# Patient Record
Sex: Male | Born: 1995 | Marital: Single | Smoking: Never smoker
Health system: Southern US, Community
[De-identification: ages and names within clinical notes are randomized; demographics above are authoritative.]

---
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
## 2016-01-06 ENCOUNTER — Encounter (INDEPENDENT_AMBULATORY_CARE_PROVIDER_SITE_OTHER): Payer: Self-pay

## 2016-01-06 ENCOUNTER — Ambulatory Visit
Admission: RE | Admit: 2016-01-06 | Discharge: 2016-01-06 | Disposition: A | Discharge status: Home / Self Care | Payer: BLUE CROSS/BLUE SHIELD | Source: Ambulatory Visit | Attending: Sports Medicine | Admitting: Sports Medicine

## 2016-01-06 ENCOUNTER — Encounter: Payer: Self-pay | Admitting: Sports Medicine

## 2016-01-06 ENCOUNTER — Ambulatory Visit (INDEPENDENT_AMBULATORY_CARE_PROVIDER_SITE_OTHER): Payer: BLUE CROSS/BLUE SHIELD | Admitting: Sports Medicine

## 2016-01-06 VITALS — BP 139/69 | HR 61 | Ht 70.0 in | Wt 183.0 lb

## 2016-01-06 DIAGNOSIS — M25511 Pain in right shoulder: Secondary | ICD-10-CM

## 2016-01-06 DIAGNOSIS — S43001A Unspecified subluxation of right shoulder joint, initial encounter: Secondary | ICD-10-CM | POA: Diagnosis not present

## 2016-01-06 NOTE — Progress Notes (Addendum)
   Subjective:    Patient ID: Steven Ramos, male    DOB: 1996-10-31, 20 y.o.   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 comes in today complaining of 5 days of right shoulder pain. He injured his shoulder while playing in a rugby match this past weekend. While being tackled to the ground he felt his shoulder sublux. He had immediate pain and was removed from the contest. He was evaluated by the athletic trainer the following day and placed into a sling. He has also been taking intermittent doses of ibuprofen. Overall his pain has improved but not resolved. It is diffuse throughout the shoulder. He has both pain and weakness with trying to reach away from his body or directly overhead. He denies any numbness or tingling in his shoulder but will get intermittent tingling into his fifth finger if he aggressively moves his shoulder. It last only a couple of seconds and then resolves. 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 He has not had any imaging. He is right-hand dominant.  Past medical history reviewed Medications reviewed Allergies reviewed    Review of Systems    as above Objective:   Physical Exam  Well-developed, fit appearing. No acute distress. Awake alert and oriented 3. Vital signs reviewed  Right shoulder: There is some mild ecchymosis along the anterior shoulder. There is no tenderness to palpation. There is some prominence of the sternoclavicular joint from his old injury here. He has limited active range of motion in all planes, particularly with forward flexion and abduction. Passively I am able to carry him to about 140 of forward flexion and 100 of abduction. There is significant weakness with resisted supraspinatus but no pain with resisted deltoid. No loss of sensation to light  touch. Positive apprehension. Positive O'Brien's. Neurovascularly intact distally.      Assessment & Plan:  Right shoulder pain status post right shoulder subluxation-rule out fracture versus labral tear versus rotator cuff tear  We will get x-rays of the right shoulder including an AP, lateral, and axillary view. I will also get an MRI arthrogram of his shoulder to rule out significant soft tissue pathology such as a rotator cuff tear or a labral tear that may need operative intervention. Phone follow-up after those studies to discuss the results and delineate a more definitive treatment plan. In the meantime, he can use his sling for comfort but I want him to start some pendulum exercises and he will use over-the-counter Aleve, 2 pills twice daily with food for the next 4 days. He will remain out of rugby at least until the results of the imaging studies are known. If imaging studies are unremarkable, and weakness persists, then I may need to consider the possibility of a transient neuropraxia as the cause of his weakness.  Addendum: X-rays reviewed. No obvious fracture. Proceed with MRI arthrogram.

## 2016-01-20 ENCOUNTER — Ambulatory Visit
Admission: RE | Admit: 2016-01-20 | Discharge: 2016-01-20 | Disposition: A | Discharge status: Home / Self Care | Payer: BLUE CROSS/BLUE SHIELD | Source: Ambulatory Visit | Attending: Sports Medicine | Admitting: Sports Medicine

## 2016-01-20 DIAGNOSIS — M25511 Pain in right shoulder: Secondary | ICD-10-CM

## 2016-01-20 MED ORDER — IOHEXOL 180 MG/ML  SOLN
15.0000 mL | Freq: Once | INTRAMUSCULAR | Status: AC | PRN
  Administered 2016-01-20: 15 mL via INTRA_ARTICULAR

## 2016-01-21 ENCOUNTER — Telehealth: Payer: Self-pay | Admitting: Sports Medicine

## 2016-01-21 NOTE — Telephone Encounter (Signed)
[facility] Dorthula NettlesJosh Landau [phone] 02/02/16 at 830a [address] [place] [id number] [phone]

## 2016-01-21 NOTE — Telephone Encounter (Signed)
I spoke with the patient on the phone today after reviewing the MRI arthrogram of his right shoulder. Dominant finding is a high-grade partial/small full-thickness tear of the infraspinatus tendon. This was a traumatic injury as a result of a rugby game. No labral tear. I recommend consultation with Dr. Dion SaucierLandau to discuss merits of arthroscopic rotator cuff repair. I'll defer further treatment to the discretion of Dr. Dion SaucierLandau. Patient will follow-up with me as needed.

## 2017-01-24 IMAGING — MR MR SHOULDER*R* W/CM
4 of 5 series · 18 of 40 positions shown · IV contrast (agent unspecified)
Comparison: None.

CLINICAL DATA: Right shoulder pain and weakness. Limited range of
motion.

EXAM:
MR ARTHROGRAM OF THE RIGHT SHOULDER
TECHNIQUE: Multiplanar, multisequence MR imaging of the right shoulder was
performed following the administration of intra-articular contrast.
CONTRAST:  See Injection Documentation.

[Series 4: T2 fat-sat · sagittal · 4.0mm · 0.44mm/px · 7 of 16 slices shown]
[im 1/16]
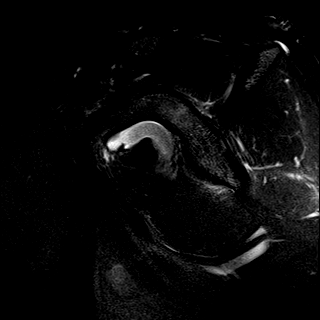
[im 3/16]
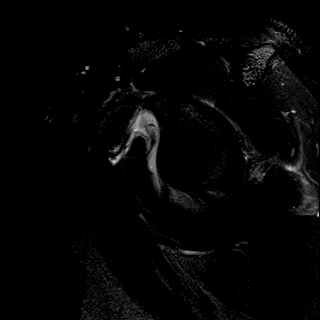
[im 6/16]
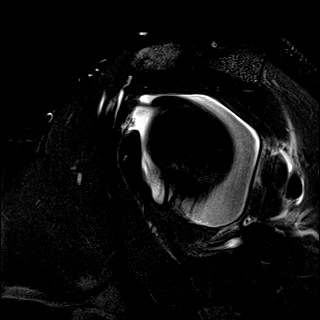
[im 8/16]
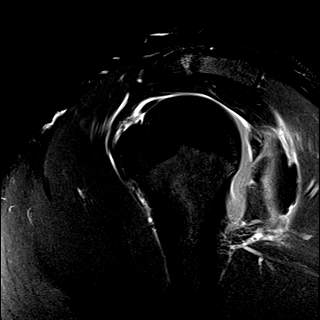
[im 11/16]
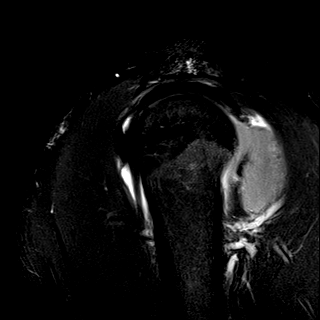
[im 13/16]
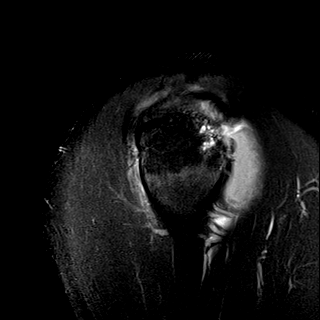
[im 16/16]
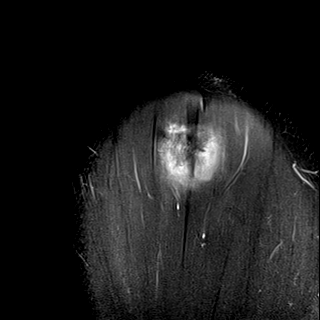

[Series 5: T1 fat-sat · axial · 4.0mm · 0.22mm/px · z∈[-41,+28]mm · 5 of 20 slices shown (1 of 2)]
[im 1/20]
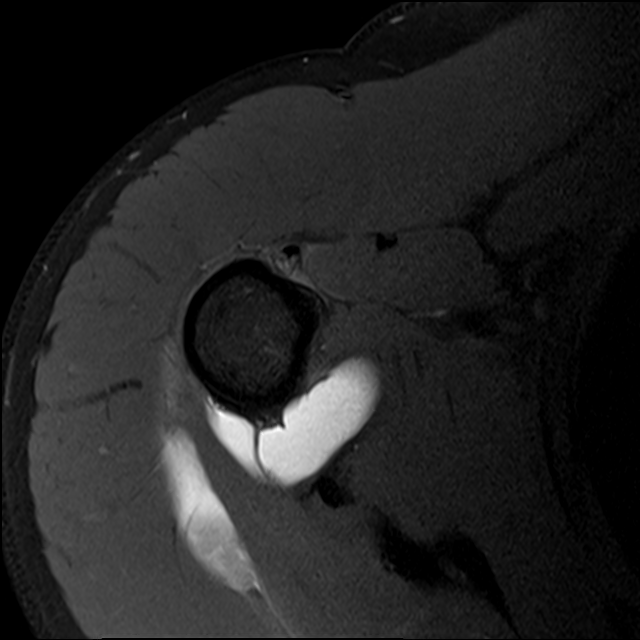
[im 3/20]
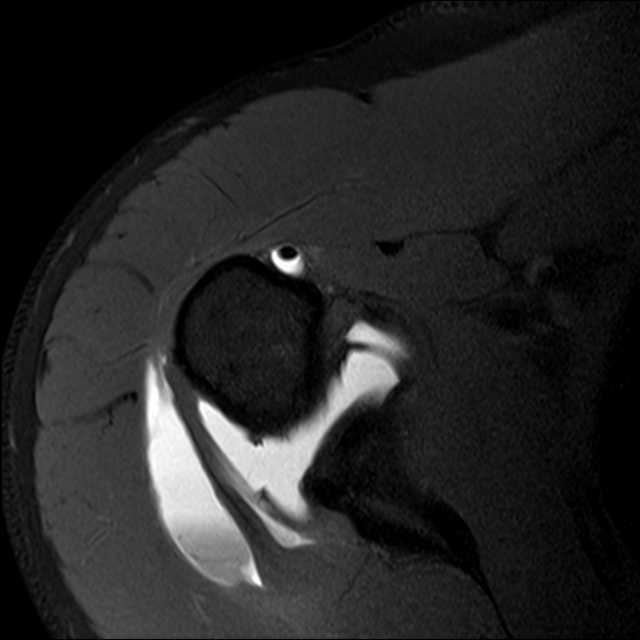
[im 5/20]
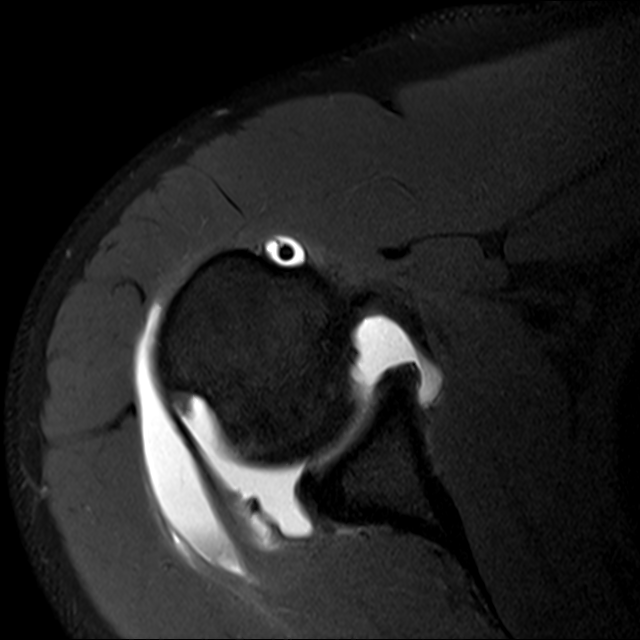
[im 10/20]
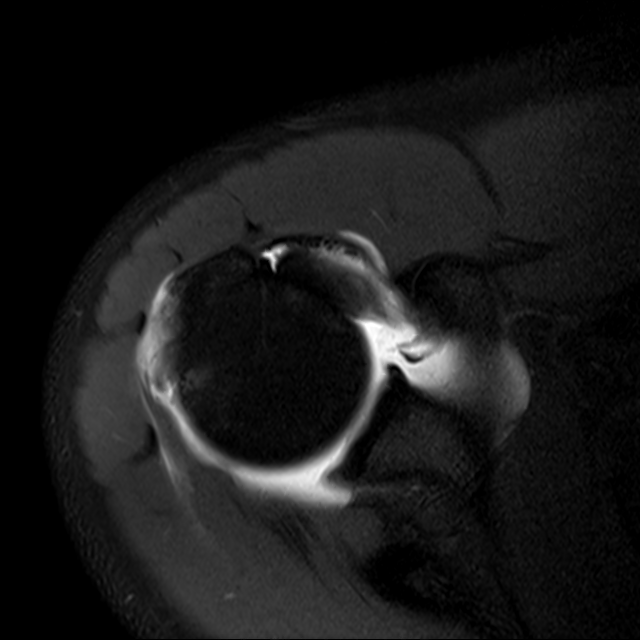
[im 17/20]
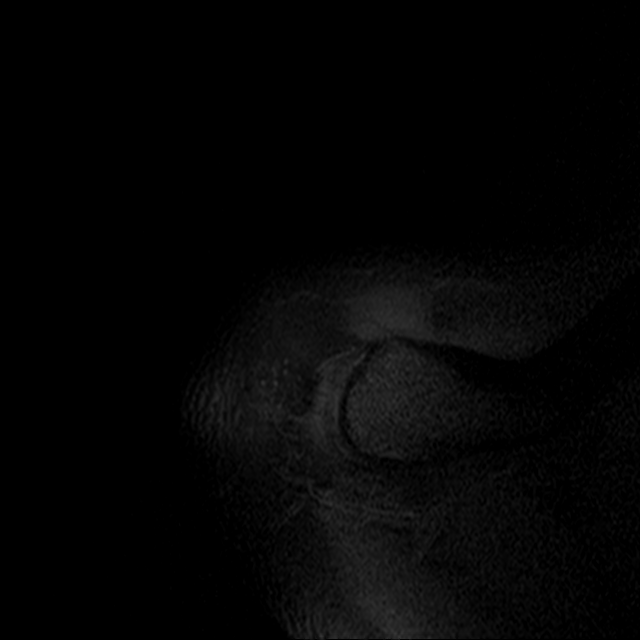

[Series 6: T1 fat-sat · oblique · 4.0mm · 0.22mm/px · 3 of 18 slices shown (2 of 2)]
[im 3/18]
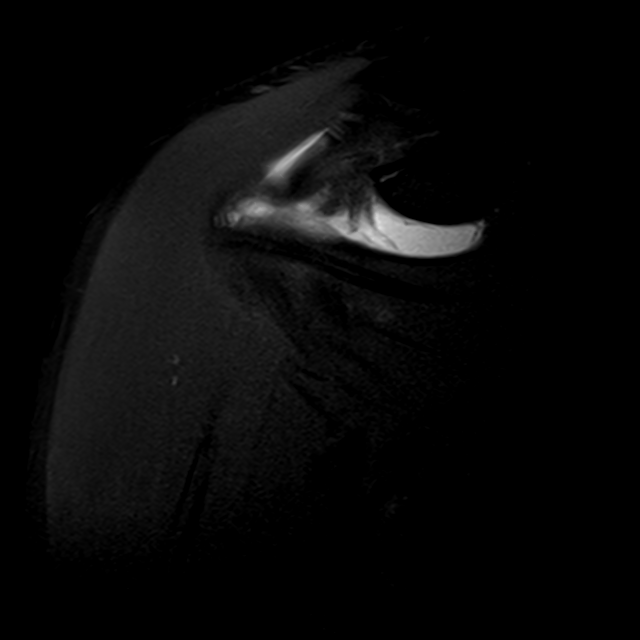
[im 10/18]
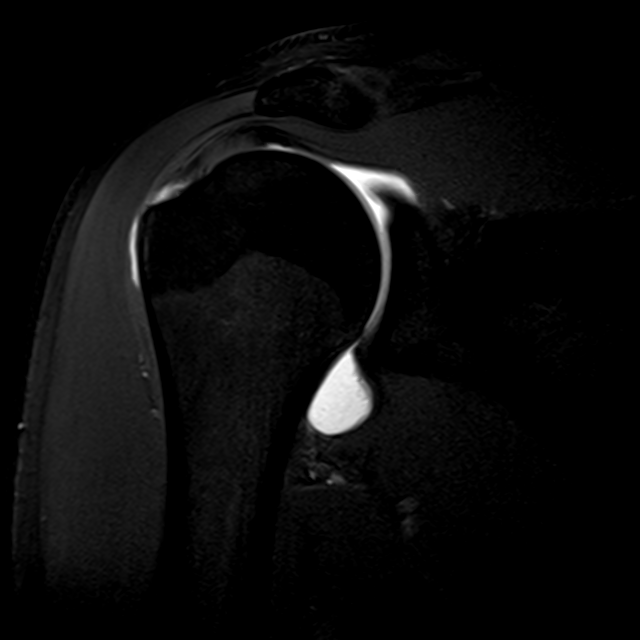
[im 15/18]
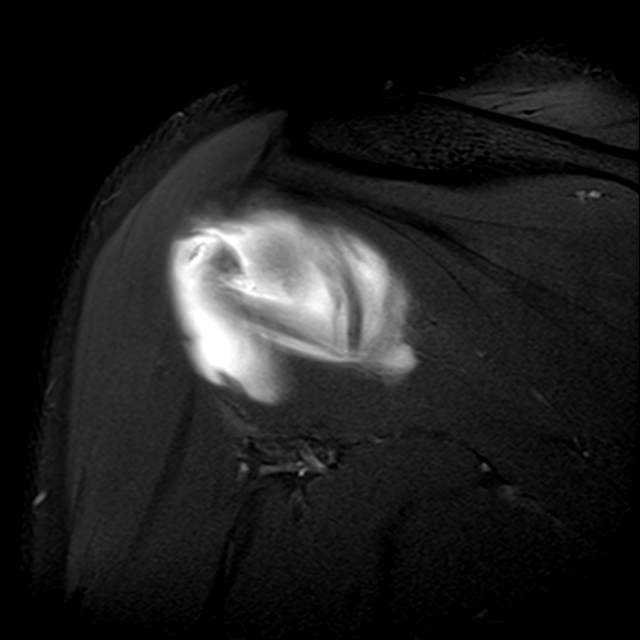

[Series 7: T1 · oblique · 4.0mm · 0.22mm/px · 3 of 18 slices shown]
[im 3/18]
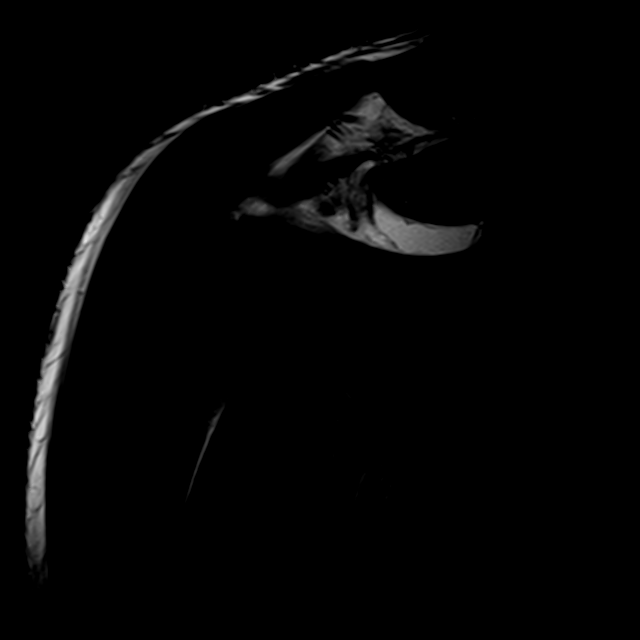
[im 10/18]
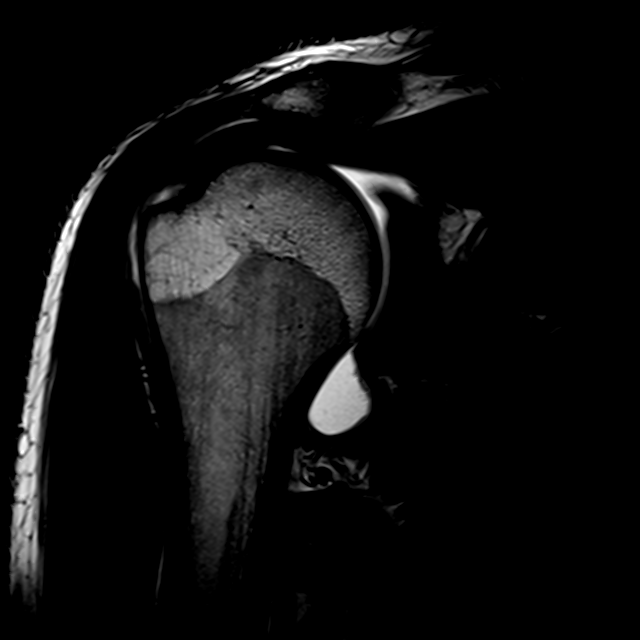
[im 15/18]
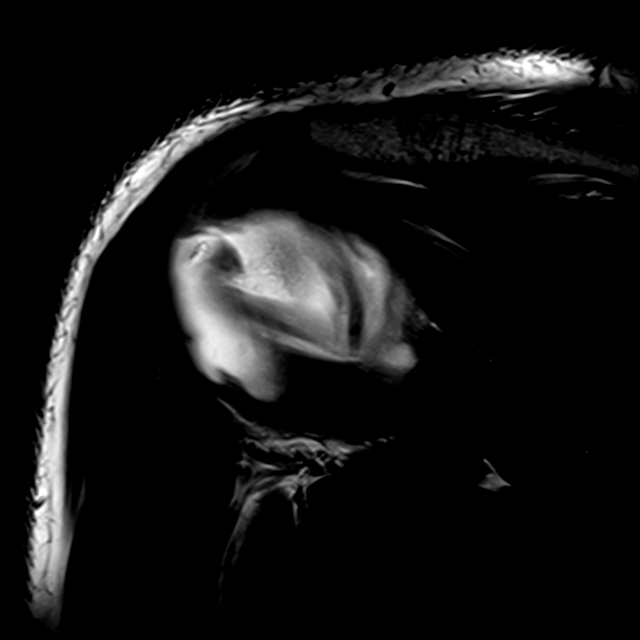

[18 of 40 positions shown; findings below may reference images not displayed]

FINDINGS: Rotator cuff: Moderate tendinosis of the supraspinatus tendon.
Severe tendinosis of the infraspinatus tendon with a high-grade
partial-thickness bursal surface tear with a small full-thickness
component. Teres minor tendon is intact. Subscapularis tendon is
intact.

Muscles: No atrophy or fatty replacement of nor abnormal signal
within, the muscles of the rotator cuff.

Biceps long head: Intact.

Acromioclavicular Joint: Normal acromioclavicular joint. Os
acromiale incidentally noted. Type II acromion.

Glenohumeral Joint: Contrast distending the joint capsule. Normal
glenohumeral ligaments. No chondral defect.

Labrum: Intact.

Bones: No focal marrow signal abnormality. No acute fracture or
dislocation.
IMPRESSION: 1. Moderate tendinosis of the supraspinatus tendon.
2. Severe tendinosis of the infraspinatus tendon with a high-grade
partial-thickness bursal surface tear with a small full-thickness
component.
